# Patient Record
Sex: Male | Born: 1965 | Race: Black or African American | Hispanic: No | Marital: Married | Smoking: Current every day smoker
Health system: Southern US, Community
[De-identification: ages and names within clinical notes are randomized; demographics above are authoritative.]

## PROBLEM LIST (undated history)

## (undated) DIAGNOSIS — G8929 Other chronic pain: Secondary | ICD-10-CM

## (undated) DIAGNOSIS — F419 Anxiety disorder, unspecified: Secondary | ICD-10-CM

## (undated) DIAGNOSIS — G4700 Insomnia, unspecified: Secondary | ICD-10-CM

## (undated) DIAGNOSIS — M5412 Radiculopathy, cervical region: Secondary | ICD-10-CM

## (undated) DIAGNOSIS — M5416 Radiculopathy, lumbar region: Secondary | ICD-10-CM

## (undated) DIAGNOSIS — G5600 Carpal tunnel syndrome, unspecified upper limb: Secondary | ICD-10-CM

## (undated) HISTORY — DX: Carpal tunnel syndrome, unspecified upper limb: G56.00

## (undated) HISTORY — DX: Other chronic pain: G89.29

## (undated) HISTORY — DX: Radiculopathy, lumbar region: M54.16

## (undated) HISTORY — DX: Anxiety disorder, unspecified: F41.9

## (undated) HISTORY — DX: Radiculopathy, cervical region: M54.12

## (undated) HISTORY — DX: Insomnia, unspecified: G47.00

---

## 2008-10-30 ENCOUNTER — Ambulatory Visit (HOSPITAL_COMMUNITY): Admission: RE | Admit: 2008-10-30 | Discharge: 2008-10-31 | Payer: Self-pay | Admitting: Neurosurgery

## 2009-05-29 IMAGING — CR DG CERVICAL SPINE 2 OR 3 VIEWS
1 series · 1 of 1 positions shown · non-contrast
Comparison: None.

CLINICAL DATA: Fusion C6-7.

CERVICAL SPINE - 2-3 VIEW

[view not recorded]
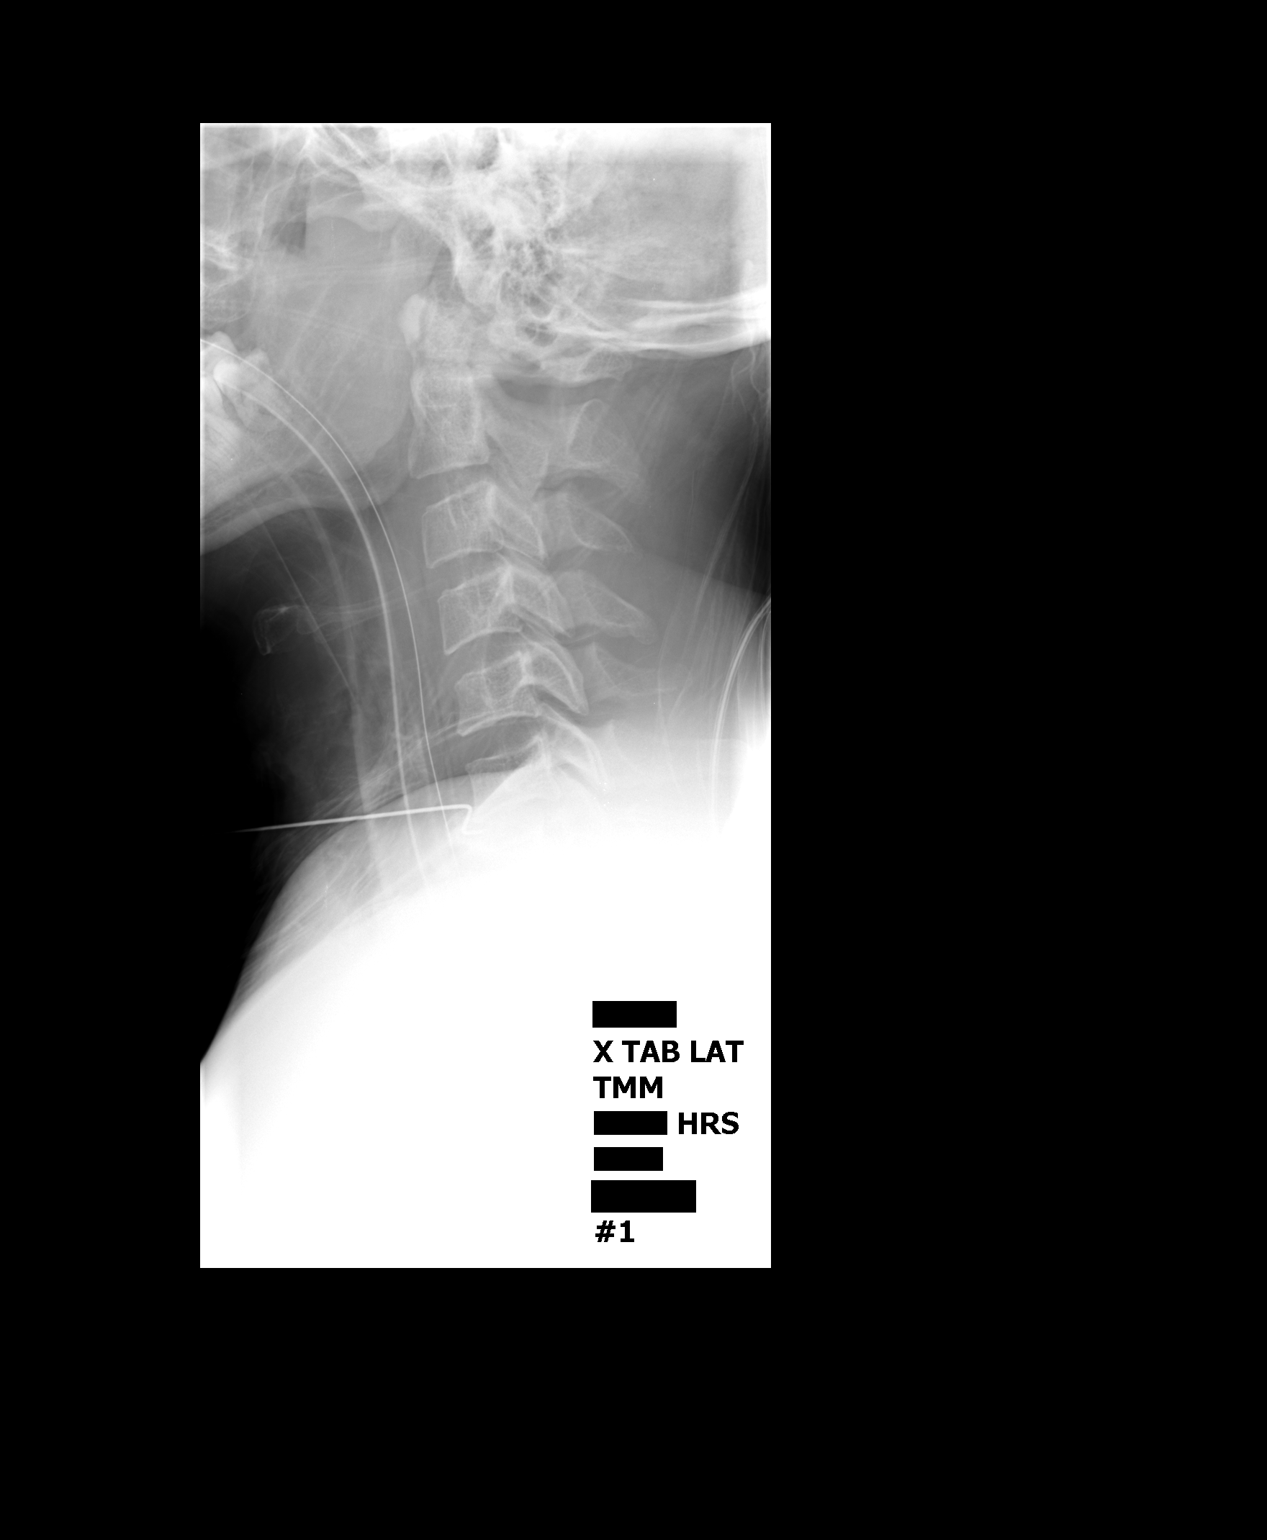

[1 of 1 positions shown; findings below may reference images not displayed]

FINDINGS: Two intraoperative lateral views of the cervical spine
submitted for review at surgery.  First film reveals metallic probe
at the C6-7 level.  Second film reveals fusion C6-7 with anterior
plate and screws with interbody spacer.  The interbody spacer and
C7 are poorly delineated secondary to patient's overlying
shoulders.  This can be evaluated on follow-up. Surgical sponge
anterior to the C6 vertebra.
IMPRESSION: Fusion C6-7.

## 2011-01-20 LAB — URINALYSIS, ROUTINE W REFLEX MICROSCOPIC
Glucose, UA: NEGATIVE mg/dL
Nitrite: NEGATIVE
pH: 5.5 (ref 5.0–8.0)

## 2011-01-20 LAB — CBC
HCT: 43.5 % (ref 39.0–52.0)
Hemoglobin: 14.3 g/dL (ref 13.0–17.0)
MCV: 92.1 fL (ref 78.0–100.0)
RBC: 4.72 MIL/uL (ref 4.22–5.81)
WBC: 6.6 10*3/uL (ref 4.0–10.5)

## 2011-01-20 LAB — BASIC METABOLIC PANEL
Chloride: 103 mEq/L (ref 96–112)
GFR calc Af Amer: >60 mL/min (ref >60)
GFR calc non Af Amer: >60 mL/min (ref >60)
Potassium: 4 mEq/L (ref 3.5–5.1)
Sodium: 137 mEq/L (ref 135–145)

## 2011-01-20 LAB — APTT: aPTT: 27 seconds (ref 24–37)

## 2011-02-18 NOTE — Op Note (Signed)
NAMECHANCELOR, HARDRICK               ACCOUNT NO.:  [id number]   MEDICAL RECORD NO.:  [id number]          PATIENT TYPE:  OIB   LOCATION:  3523                         FACILITY:  [facility]   PHYSICIAN:  Clydene Fake, M.D.  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   PROCEDURE:  Anterior cervical decompression diskectomy fusion at C6-7  with LifeNet allograft bone, Trestle anterior cervical plate.   SURGEON:  Clydene Fake, MD   ASSISTANT:  Danae Orleans. Venetia Maxon, MD   ANESTHESIA:  General endotracheal tube anesthesia.   ESTIMATED BLOOD LOSS:  Minimal.   BLOOD GIVEN:  None.   DRAINS:  None.   COMPLICATIONS:  None.   REASON FOR PROCEDURE:  The patient is a 45 year old gentleman with neck  and right arm shoulder pain and weakness, right triceps with giveaway  weakness but it is at least 5-/5 strength.  Sensation intact.  MRI was  done showing foraminal far lateral disk herniation in the right side at  C6-7 compressing the right C7 root.  The patient brought in for  decompression and fusion.   PROCEDURE IN DETAIL:  The patient was brought to the operating room and  general anesthesia was induced.  The patient was placed in 10-pound  Halter traction, prepped and draped in the sterile fashion.  Site of  incision was injected with 10 mL of 1% lidocaine with epinephrine.  Incision was then made from the midline to the anterior border of the  sternocleidomastoid muscle on the left side.  The neck incision was  taken down to the platysma and hemostasis obtained with Bovie  cauterization.  The platysma was incised with a Bovie and blunt  dissection, taken through the anterior cervical fascia of the anterior  cervical spine.  Needle was placed in  interspace.  X-rays were obtained  showing the C6-7 interspace.  Disk space was incised with a #15 blade  and partial diskectomy was performed.  As the needle was removed, longus  coli muscles were reflected laterally on each side using the Bovie and  self-retaining retractors were placed.  Distraction pins were placed in  the C6-7, and the interspace distracted.  Microscope was brought in for  microdissection, diskectomy continued with pituitary rongeurs, curettes.  Then, 1-mm and 2-mm Kerrison punches were used to remove posterior disk  osteophyte ligament decompressing the central canal and performing  bilateral foraminotomies.  There were significant fragments of disk that  were removed out of the right foramen of the nerve root, after we  removed this, we had decompression of the nerve roots.  Hemostasis with  Gelfoam and thrombin, this was irrigated out.  We used a high-speed  drill to remove cartilaginous end plate.  We then measured height of  disk space to be 7 mm and a 7-mm LifeNet allograft bone was tapped into  place, countersunk a couple of millimeters.  Distraction pins were  removed.  Hemostasis obtained with Gelfoam and thrombin.  Weight was  removed from the traction.  The bone was firmly in place, and the  Trestle anterior cervical plate was placed over the anterior cervical  spine.  Two screws were placed, two in the C6 and two in the C7, these  were tightened down.  Lateral x-rays were obtained showing good position  of the plate, screws, and bone plug at the C6-7 level.  Retractors were  removed.  Hemostasis was obtained with bipolar cauterization.  We  irrigated with antibiotic solution with good hemostasis.  The platysma  was closed with 3-0 Vicryl interrupted sutures.  Subcutaneous tissue was  closed with the same.  Skin closed with benzoin and Steri-Strips.  Dressing was placed.  The patient was placed in a soft cervical collar,  awoken from anesthesia, and  transferred to recovery room in stable  condition.           ______________________________  Clydene Fake, M.D.     JRH/MEDQ  D:  10/30/2008  T:  10/31/2008  Job:  [id number]

## 2018-05-17 ENCOUNTER — Ambulatory Visit: Payer: Managed Care, Other (non HMO) | Admitting: Diagnostic Neuroimaging

## 2018-05-17 ENCOUNTER — Telehealth: Payer: Self-pay | Admitting: *Deleted

## 2018-05-17 ENCOUNTER — Encounter: Payer: Self-pay | Admitting: Diagnostic Neuroimaging

## 2018-05-17 ENCOUNTER — Encounter: Payer: Self-pay | Admitting: *Deleted

## 2018-05-17 ENCOUNTER — Other Ambulatory Visit: Payer: Self-pay | Admitting: *Deleted

## 2018-05-17 NOTE — Telephone Encounter (Signed)
Patient was no show for new patient appointment today.
